# Patient Record
Sex: Female | Born: 1985 | Race: White | Hispanic: No | Marital: Married | Smoking: Never smoker
Health system: Southern US, Community
[De-identification: ages and names within clinical notes are randomized; demographics above are authoritative.]

## PROBLEM LIST (undated history)

## (undated) DIAGNOSIS — Z8489 Family history of other specified conditions: Secondary | ICD-10-CM

## (undated) DIAGNOSIS — K219 Gastro-esophageal reflux disease without esophagitis: Secondary | ICD-10-CM

## (undated) DIAGNOSIS — R519 Headache, unspecified: Secondary | ICD-10-CM

## (undated) DIAGNOSIS — M549 Dorsalgia, unspecified: Secondary | ICD-10-CM

## (undated) HISTORY — PX: WISDOM TOOTH EXTRACTION: SHX21

---

## 2007-06-20 ENCOUNTER — Inpatient Hospital Stay (HOSPITAL_COMMUNITY): Admission: AD | Admit: 2007-06-20 | Discharge: 2007-06-20 | Payer: Self-pay | Admitting: Obstetrics and Gynecology

## 2007-06-24 ENCOUNTER — Inpatient Hospital Stay (HOSPITAL_COMMUNITY): Admission: AD | Admit: 2007-06-24 | Discharge: 2007-06-24 | Payer: Self-pay | Admitting: Obstetrics and Gynecology

## 2007-09-27 ENCOUNTER — Ambulatory Visit (HOSPITAL_COMMUNITY): Admission: RE | Admit: 2007-09-27 | Discharge: 2007-09-27 | Payer: Self-pay | Admitting: Obstetrics

## 2007-10-25 ENCOUNTER — Ambulatory Visit (HOSPITAL_COMMUNITY): Admission: RE | Admit: 2007-10-25 | Discharge: 2007-10-25 | Payer: Self-pay | Admitting: Obstetrics

## 2007-11-22 ENCOUNTER — Ambulatory Visit (HOSPITAL_COMMUNITY): Admission: RE | Admit: 2007-11-22 | Discharge: 2007-11-22 | Payer: Self-pay | Admitting: Obstetrics

## 2007-12-20 ENCOUNTER — Ambulatory Visit (HOSPITAL_COMMUNITY): Admission: RE | Admit: 2007-12-20 | Discharge: 2007-12-20 | Payer: Self-pay | Admitting: Obstetrics

## 2008-01-15 ENCOUNTER — Inpatient Hospital Stay (HOSPITAL_COMMUNITY): Admission: AD | Admit: 2008-01-15 | Discharge: 2008-01-15 | Payer: Self-pay | Admitting: Obstetrics and Gynecology

## 2008-02-04 ENCOUNTER — Inpatient Hospital Stay (HOSPITAL_COMMUNITY): Admission: AD | Admit: 2008-02-04 | Discharge: 2008-02-07 | Payer: Self-pay | Admitting: *Deleted

## 2008-02-04 ENCOUNTER — Encounter (INDEPENDENT_AMBULATORY_CARE_PROVIDER_SITE_OTHER): Payer: Self-pay | Admitting: *Deleted

## 2009-03-08 ENCOUNTER — Inpatient Hospital Stay (HOSPITAL_COMMUNITY): Admission: AD | Admit: 2009-03-08 | Discharge: 2009-03-08 | Payer: Self-pay | Admitting: Obstetrics & Gynecology

## 2009-03-12 ENCOUNTER — Inpatient Hospital Stay (HOSPITAL_COMMUNITY): Admission: AD | Admit: 2009-03-12 | Discharge: 2009-03-12 | Payer: Self-pay | Admitting: Obstetrics and Gynecology

## 2009-03-12 ENCOUNTER — Ambulatory Visit: Payer: Self-pay | Admitting: Family

## 2009-03-15 ENCOUNTER — Ambulatory Visit (HOSPITAL_COMMUNITY): Admission: RE | Admit: 2009-03-15 | Discharge: 2009-03-15 | Payer: Self-pay | Admitting: Obstetrics & Gynecology

## 2009-04-18 ENCOUNTER — Ambulatory Visit (HOSPITAL_COMMUNITY): Admission: RE | Admit: 2009-04-18 | Discharge: 2009-04-18 | Payer: Self-pay | Admitting: Obstetrics

## 2009-04-22 ENCOUNTER — Ambulatory Visit (HOSPITAL_COMMUNITY): Admission: AD | Admit: 2009-04-22 | Discharge: 2009-04-22 | Payer: Self-pay | Admitting: Obstetrics

## 2009-04-24 ENCOUNTER — Ambulatory Visit (HOSPITAL_COMMUNITY): Admission: RE | Admit: 2009-04-24 | Discharge: 2009-04-24 | Payer: Self-pay | Admitting: Obstetrics

## 2009-05-02 ENCOUNTER — Ambulatory Visit (HOSPITAL_COMMUNITY): Admission: RE | Admit: 2009-05-02 | Discharge: 2009-05-02 | Payer: Self-pay | Admitting: Obstetrics

## 2009-05-09 ENCOUNTER — Ambulatory Visit (HOSPITAL_COMMUNITY): Admission: RE | Admit: 2009-05-09 | Discharge: 2009-05-09 | Payer: Self-pay | Admitting: Obstetrics

## 2009-05-21 ENCOUNTER — Ambulatory Visit (HOSPITAL_COMMUNITY): Admission: RE | Admit: 2009-05-21 | Discharge: 2009-05-21 | Payer: Self-pay | Admitting: Obstetrics

## 2009-06-18 ENCOUNTER — Ambulatory Visit (HOSPITAL_COMMUNITY): Admission: RE | Admit: 2009-06-18 | Discharge: 2009-06-18 | Payer: Self-pay | Admitting: Obstetrics

## 2009-07-18 ENCOUNTER — Ambulatory Visit (HOSPITAL_COMMUNITY): Admission: RE | Admit: 2009-07-18 | Discharge: 2009-07-18 | Payer: Self-pay | Admitting: Obstetrics

## 2009-08-15 ENCOUNTER — Ambulatory Visit (HOSPITAL_COMMUNITY): Admission: RE | Admit: 2009-08-15 | Discharge: 2009-08-15 | Payer: Self-pay | Admitting: Obstetrics & Gynecology

## 2009-09-26 ENCOUNTER — Ambulatory Visit (HOSPITAL_COMMUNITY): Admission: RE | Admit: 2009-09-26 | Discharge: 2009-09-26 | Payer: Self-pay | Admitting: Obstetrics & Gynecology

## 2009-10-16 ENCOUNTER — Inpatient Hospital Stay (HOSPITAL_COMMUNITY): Admission: AD | Admit: 2009-10-16 | Discharge: 2009-10-17 | Payer: Self-pay | Admitting: Obstetrics and Gynecology

## 2009-10-17 ENCOUNTER — Ambulatory Visit (HOSPITAL_COMMUNITY): Admission: RE | Admit: 2009-10-17 | Discharge: 2009-10-17 | Payer: Self-pay | Admitting: Obstetrics & Gynecology

## 2009-10-18 ENCOUNTER — Ambulatory Visit (HOSPITAL_COMMUNITY): Admission: RE | Admit: 2009-10-18 | Discharge: 2009-10-18 | Payer: Self-pay | Admitting: Obstetrics and Gynecology

## 2009-10-24 ENCOUNTER — Ambulatory Visit (HOSPITAL_COMMUNITY): Admission: RE | Admit: 2009-10-24 | Discharge: 2009-10-24 | Payer: Self-pay | Admitting: Obstetrics and Gynecology

## 2009-10-31 ENCOUNTER — Ambulatory Visit: Admission: RE | Admit: 2009-10-31 | Discharge: 2009-10-31 | Payer: Self-pay | Admitting: Obstetrics and Gynecology

## 2009-11-01 ENCOUNTER — Inpatient Hospital Stay (HOSPITAL_COMMUNITY): Admission: AD | Admit: 2009-11-01 | Discharge: 2009-11-03 | Payer: Self-pay | Admitting: Obstetrics and Gynecology

## 2009-11-01 ENCOUNTER — Encounter (INDEPENDENT_AMBULATORY_CARE_PROVIDER_SITE_OTHER): Payer: Self-pay | Admitting: Obstetrics and Gynecology

## 2010-07-27 ENCOUNTER — Encounter: Payer: Self-pay | Admitting: Obstetrics

## 2010-09-23 LAB — RPR
RPR Ser Ql: REACTIVE — AB
RPR Ser Ql: REACTIVE — AB

## 2010-09-23 LAB — CBC
HCT: 31.5 % — ABNORMAL LOW (ref 36.0–46.0)
MCHC: 33.2 g/dL (ref 30.0–36.0)
MCV: 77.7 fL — ABNORMAL LOW (ref 78.0–100.0)
Platelets: 182 10*3/uL (ref 150–400)
Platelets: 209 10*3/uL (ref 150–400)
RDW: 16.1 % — ABNORMAL HIGH (ref 11.5–15.5)
WBC: 16.2 10*3/uL — ABNORMAL HIGH (ref 4.0–10.5)

## 2010-09-23 LAB — T.PALLIDUM AB, IGG: T pallidum Antibodies (TP-PA): 0.7 IV (ref <1.0)

## 2010-09-24 LAB — GC/CHLAMYDIA PROBE AMP, GENITAL
Chlamydia, DNA Probe: NEGATIVE
GC Probe Amp, Genital: NEGATIVE

## 2010-09-24 LAB — WET PREP, GENITAL: Trich, Wet Prep: NONE SEEN

## 2010-10-10 LAB — CBC
Platelets: 188 10*3/uL (ref 150–400)
RDW: 15.4 % (ref 11.5–15.5)

## 2010-10-10 LAB — GC/CHLAMYDIA PROBE AMP, GENITAL: GC Probe Amp, Genital: NEGATIVE

## 2010-10-10 LAB — POCT PREGNANCY, URINE: Preg Test, Ur: POSITIVE

## 2010-10-10 LAB — URINE MICROSCOPIC-ADD ON

## 2010-10-10 LAB — URINALYSIS, ROUTINE W REFLEX MICROSCOPIC
Glucose, UA: NEGATIVE mg/dL
Specific Gravity, Urine: 1.02 (ref 1.005–1.030)

## 2010-10-10 LAB — WET PREP, GENITAL: Clue Cells Wet Prep HPF POC: NONE SEEN

## 2010-10-10 LAB — HCG, QUANTITATIVE, PREGNANCY: hCG, Beta Chain, Quant, S: 19757 m[IU]/mL — ABNORMAL HIGH (ref <5)

## 2010-10-10 LAB — ABO/RH: ABO/RH(D): A POS

## 2010-11-18 NOTE — Consult Note (Signed)
Jamie Mosley, Jamie Mosley            ACCOUNT NO.:  [id number]   MEDICAL RECORD NO.:  [id number]          PATIENT TYPE:  MAT   LOCATION:  [facility]                          FACILITY:  [facility]   PHYSICIAN:  Lenoard Aden, M.D.DATE OF BIRTH:  02/23/1986   DATE OF CONSULTATION:  DATE OF DISCHARGE:  01/15/2008                                 CONSULTATION   CHIEF COMPLAINT:  Rule out ruptured membranes.   HISTORY OF PRESENT ILLNESS:  She is a 25 year old white female G1, P0,  at 22 weeks' gestation, who presents with questionable leakage of fluid  this afternoon.  She is a nonsmoker and nondrinker.  She denies domestic  physical violence.   MEDICATIONS:  Valtrex, prenatal vitamins.  She does report questionable  new-onset herpes prodrome.   ALLERGIES:  She has allergies only to sweet potatoes.   FAMILY HISTORY:  Anemia, alcohol abuse.  Previous pregnancy,  noncontributory.   SOCIAL HISTORY:  Noncontributory.   Prenatal course complicated by slightly elevated blood pressure.   PHYSICAL EXAMINATION:  GENERAL:  She is a well-developed, well-nourished  white female in no acute distress.  HEENT:  Normal.  LUNGS:  Clear.  HEART:  Regular rate and rhythm.  ABDOMEN:  Soft, gravid, and nontender.  CERVICAL:  Deferred.  EXTREMITIES:  DTRs 2+ and equal.  NEUROLOGIC:  Nonfocal.  SKIN:  Intact.  PERINEAL:  Reveals no evidence of rupture of membranes, fern and  Nitrazine negative.  No evidence of active herpes lesions.   IMPRESSION:  1. A 36-week OB.  2. No evidence of spontaneous rupture of membranes with reactive      nonstress test noted.  3. New onset of prodromal herpes simplex virus.  We will increase the      therapeutic dosages.  Increase to 500 b.i.d. x7 days.   PLAN:  To follow up in the office as scheduled.      Lenoard Aden, M.D.  Electronically Signed     RJT/MEDQ  D:  01/15/2008  T:  01/16/2008  Job:  [id number]

## 2011-01-16 IMAGING — US US OB TRANSVAGINAL
1 series · 14 of 22 positions shown · non-contrast
Comparison: none

OBSTETRICAL ULTRASOUND:
 This ultrasound exam was performed in the [HOSPITAL] Ultrasound Department.  The OB US report was generated in the AS system, and faxed to the ordering physician.  This report is also available in [REDACTED] PACS.

[Series 1: us ob transvaginal · 0.12mm/px · 14 of 22 slices shown]
[im 1/22]
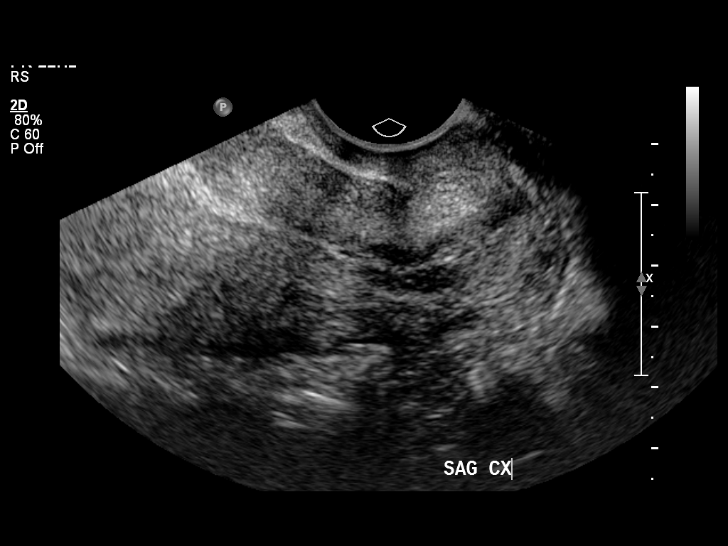
[im 3/22]
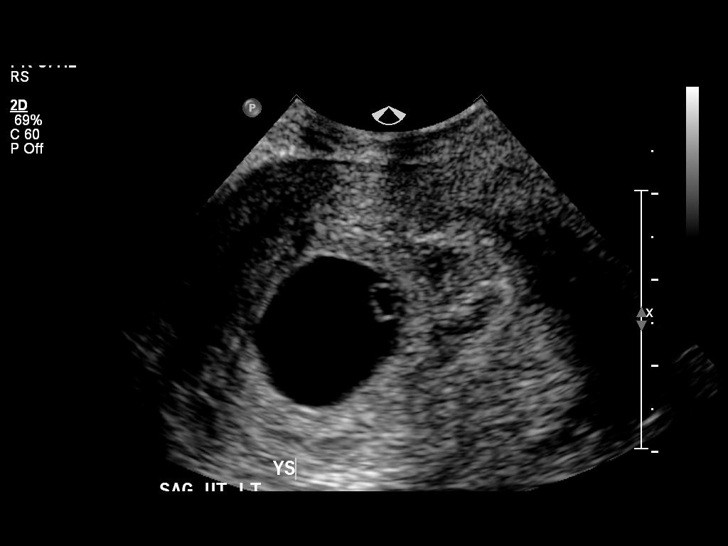
[im 4/22]
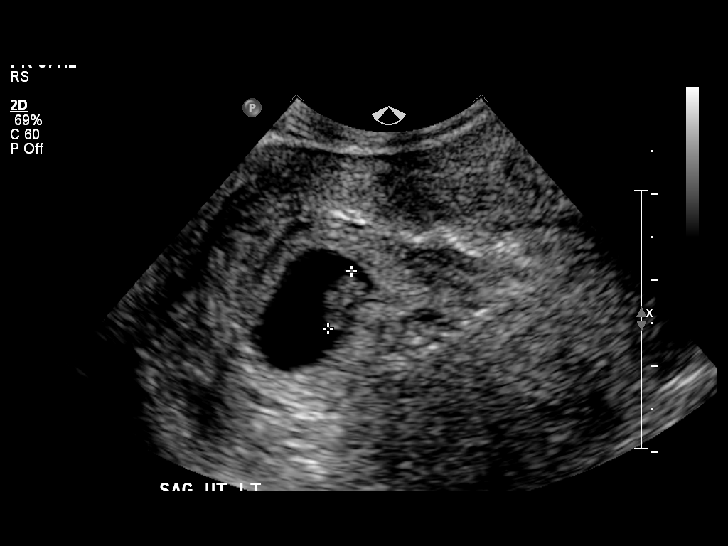
[im 6/22]
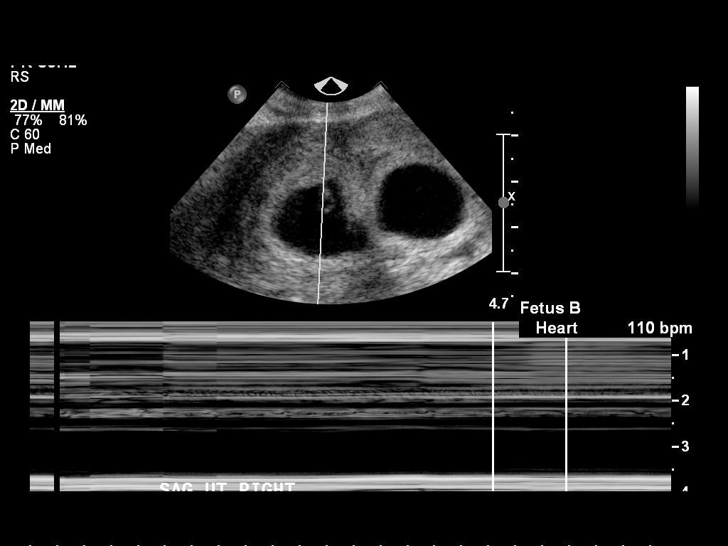
[im 8/22]
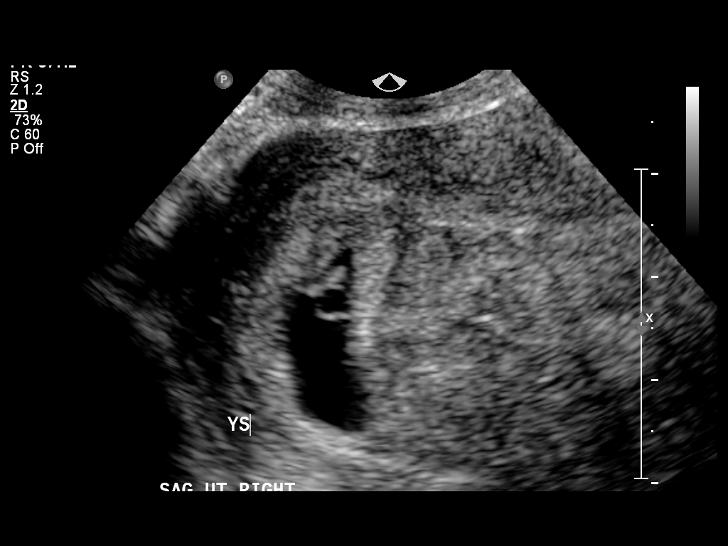
[im 9/22]
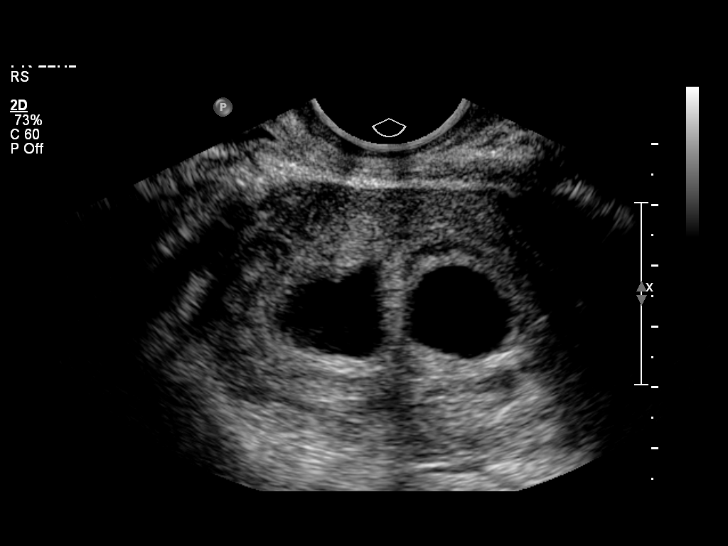
[im 11/22]
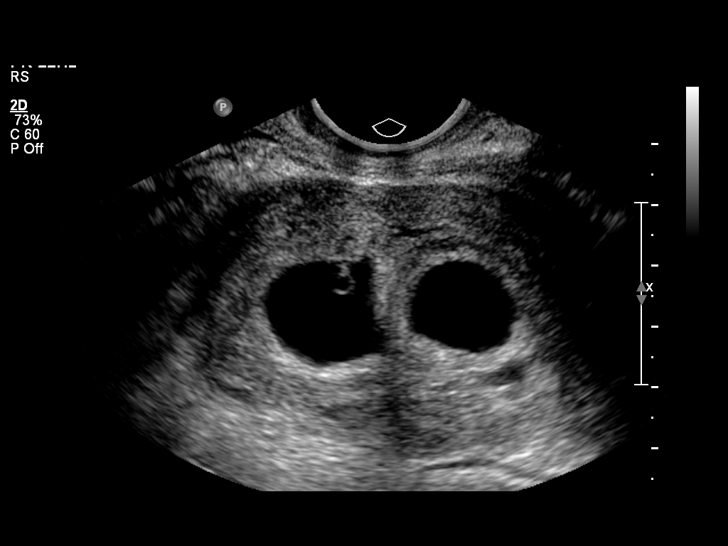
[im 12/22]
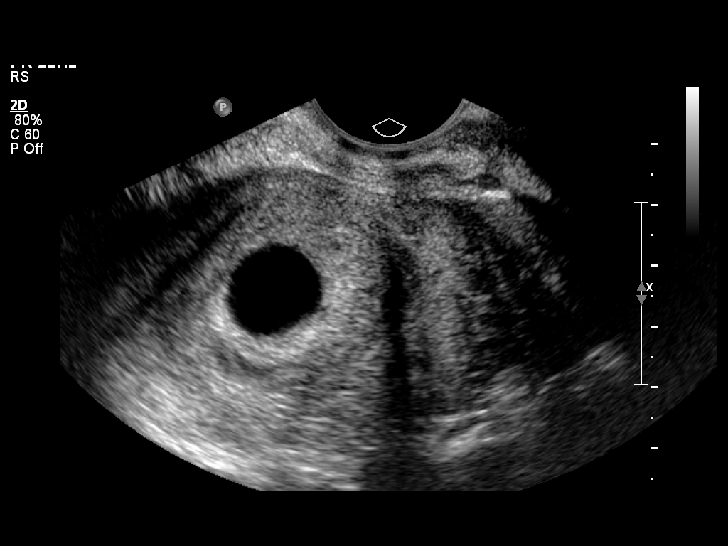
[im 14/22]
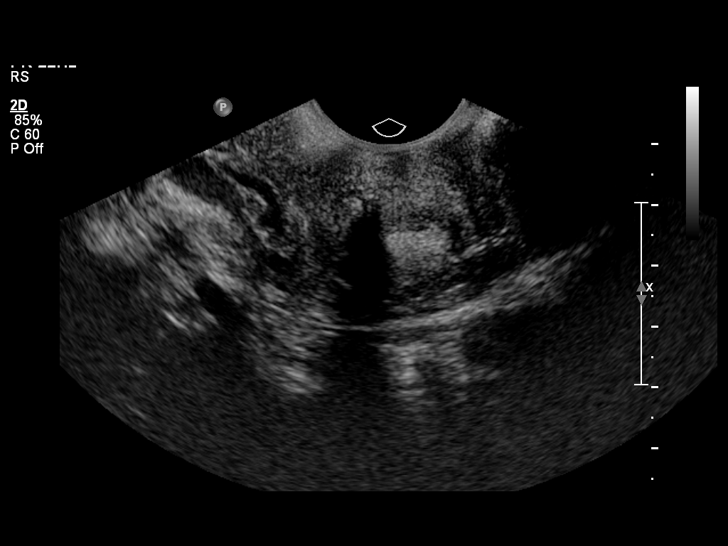
[im 15/22]
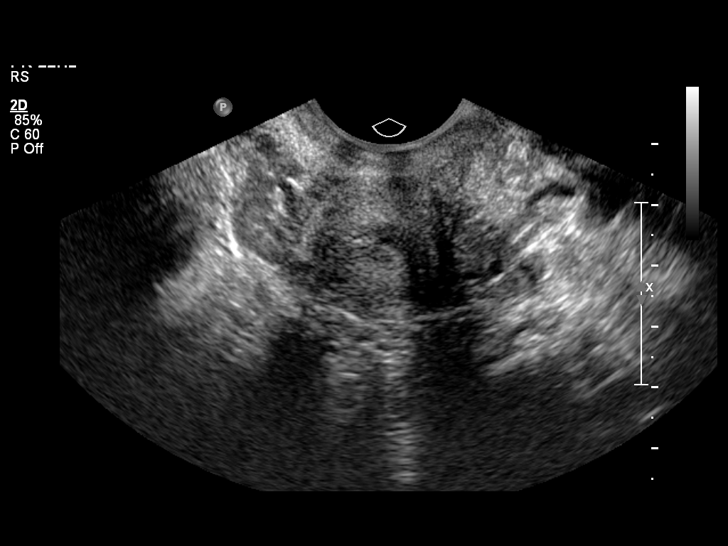
[im 17/22]
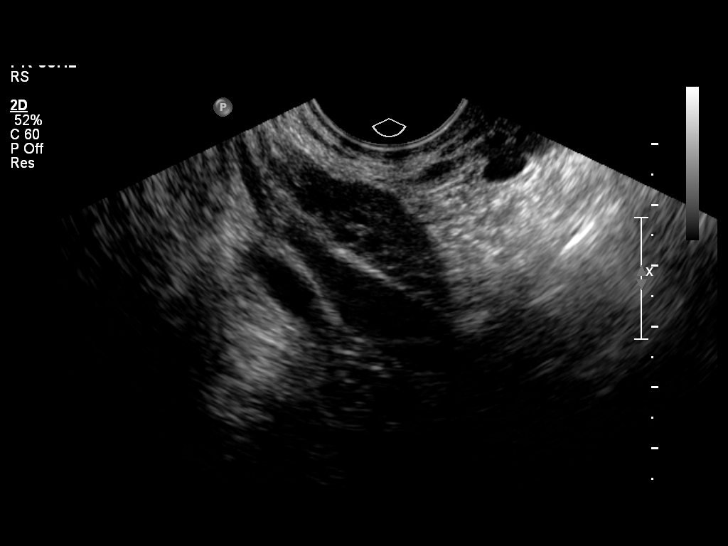
[im 19/22]
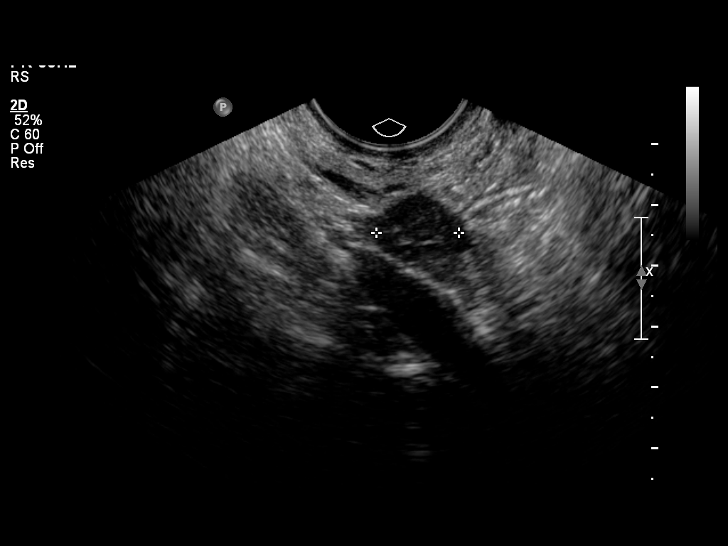
[im 20/22]
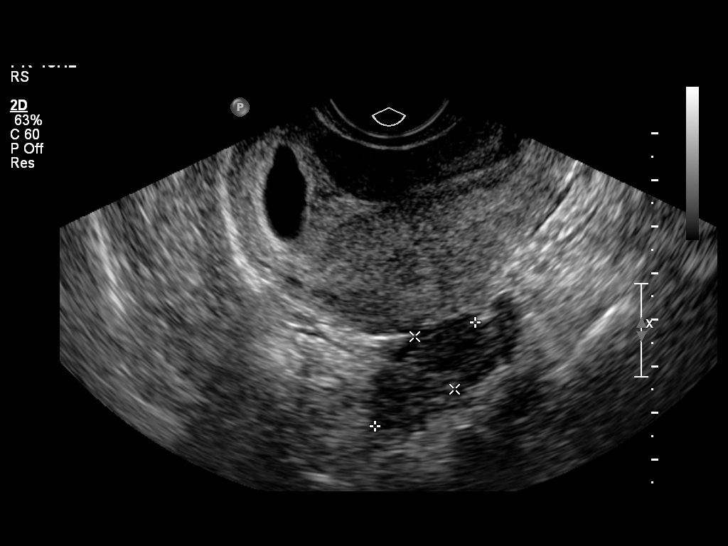
[im 22/22]
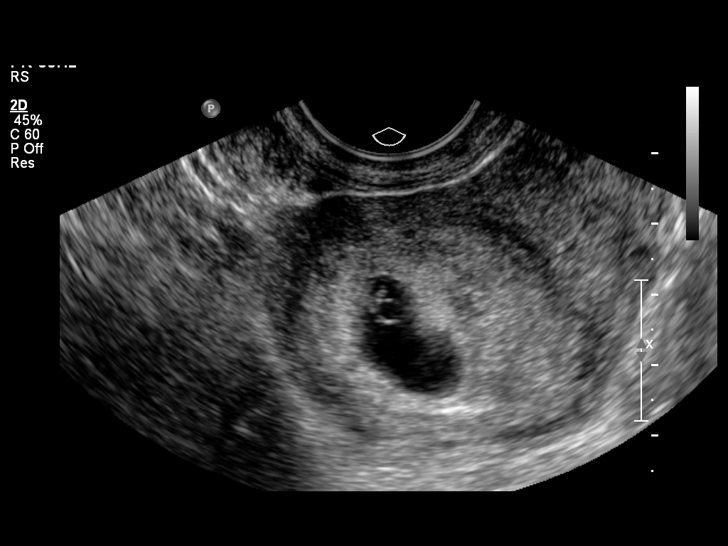

[14 of 22 positions shown; findings below may reference images not displayed]

IMPRESSION: See AS Obstetric US report.

## 2011-03-30 LAB — TORCH-IGM(TOXO/ RUB/ CMV/ HSV) W TITER
CMV IgM: 0.48 IV
Rubella IgM Index: 0.63 IV
Toxoplasma IgM: 0.36 IV

## 2011-03-30 LAB — TORCH TITERS-IGG(TOXO/ RUB/ CMV/ HSV)
CMV IgG: 9.34 IV
Toxoplasma IgG Antibody (EIA): <5 IU/mL

## 2011-03-31 LAB — TOXOPLASMA GONDII ANTIBODY, IGM: Toxoplasma Antibody- IgM: <0.1 IV

## 2011-04-03 LAB — CBC
HCT: 31 — ABNORMAL LOW
Hemoglobin: 10.3 — ABNORMAL LOW
Hemoglobin: 11.5 — ABNORMAL LOW
MCHC: 33.4
MCV: 85
RBC: 3.61 — ABNORMAL LOW
RBC: 4.07
WBC: 14.7 — ABNORMAL HIGH

## 2011-04-03 LAB — RPR: RPR Ser Ql: NONREACTIVE

## 2011-04-10 LAB — POCT PREGNANCY, URINE
Operator id: 134401
Preg Test, Ur: POSITIVE

## 2011-04-10 LAB — URINALYSIS, ROUTINE W REFLEX MICROSCOPIC
Glucose, UA: NEGATIVE
Hgb urine dipstick: NEGATIVE
Protein, ur: NEGATIVE
Specific Gravity, Urine: >1.03 — ABNORMAL HIGH

## 2011-04-10 LAB — WET PREP, GENITAL: Yeast Wet Prep HPF POC: NONE SEEN

## 2011-04-10 LAB — ABO/RH: ABO/RH(D): A POS

## 2011-07-30 IMAGING — US US OB FOLLOW-UP
1 series · 14 of 28 positions shown · non-contrast
Comparison: none

OBSTETRICAL ULTRASOUND:
 This ultrasound was performed in The [HOSPITAL], and the AS OB/GYN report will be stored to [REDACTED] PACS.  This report is also available in [HOSPITAL]?s accessANYware.

[Series 1: us ob follow-up · 14 of 31 slices shown]
[im 2/31]
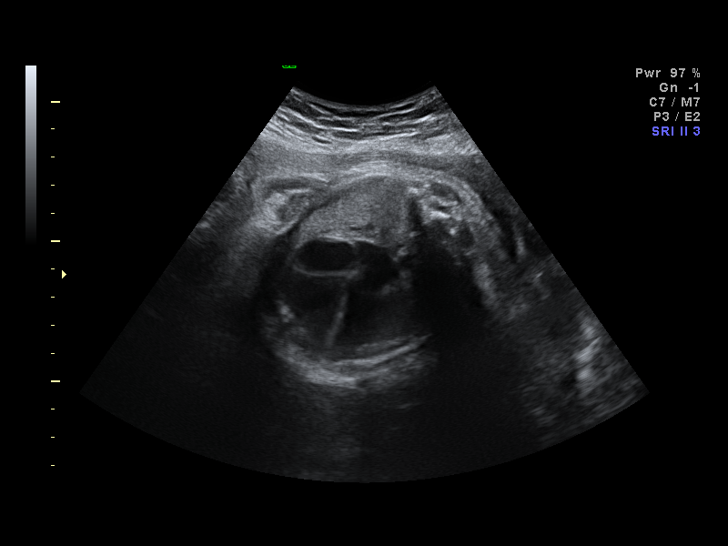
[im 4/31]
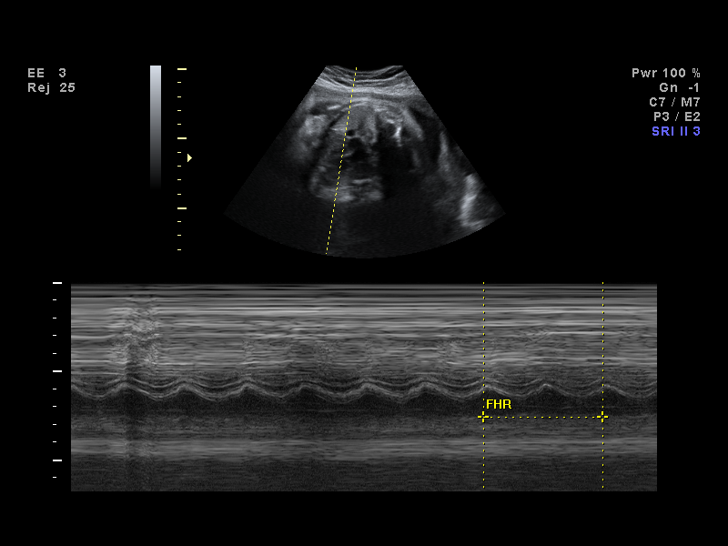
[im 6/31]
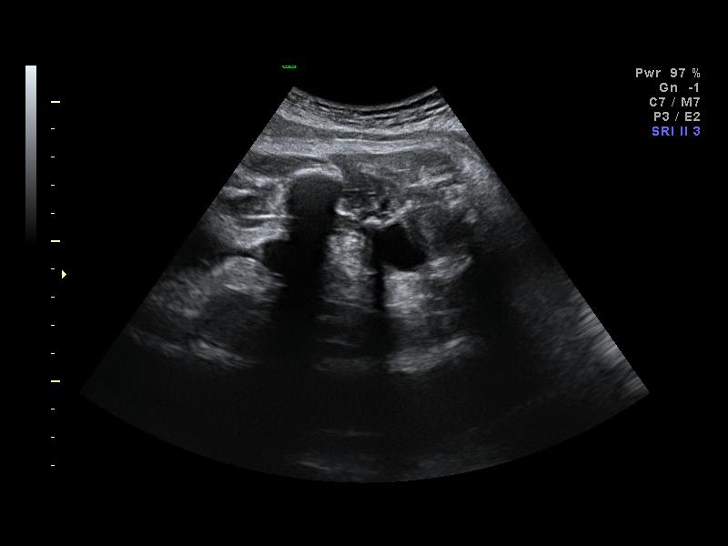
[im 8/31]
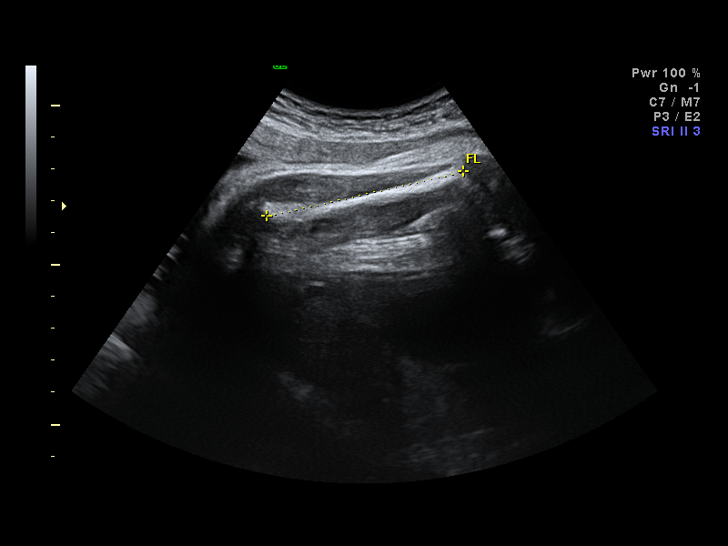
[im 11/31]
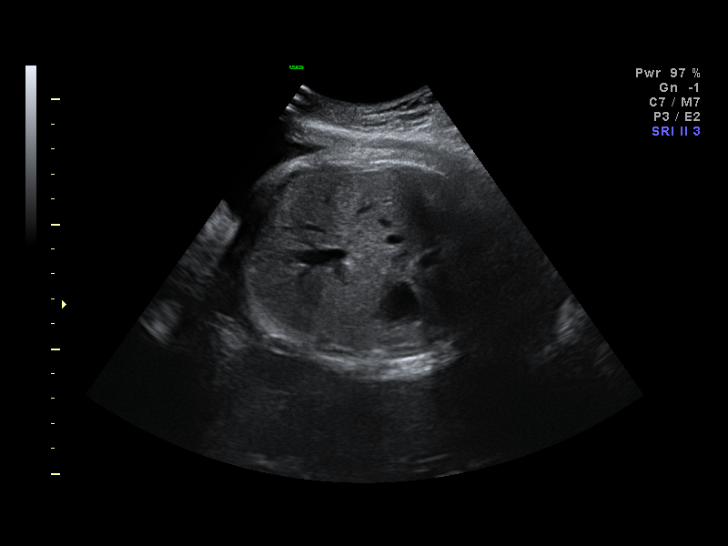
[im 13/31]
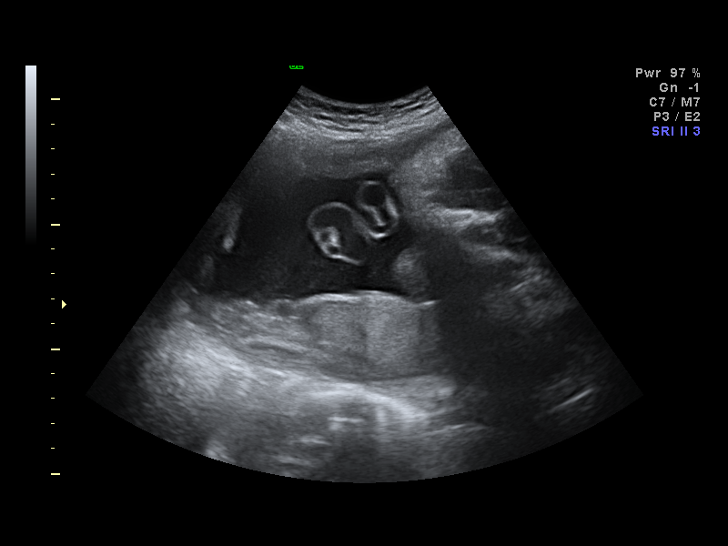
[im 15/31]
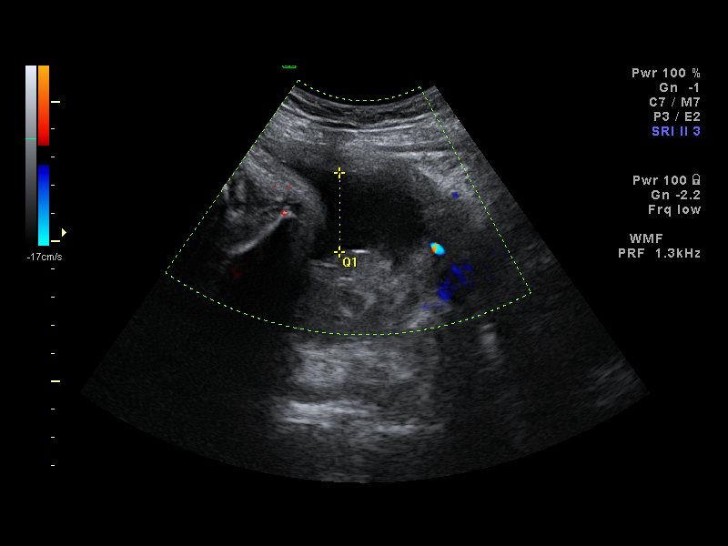
[im 17/31]
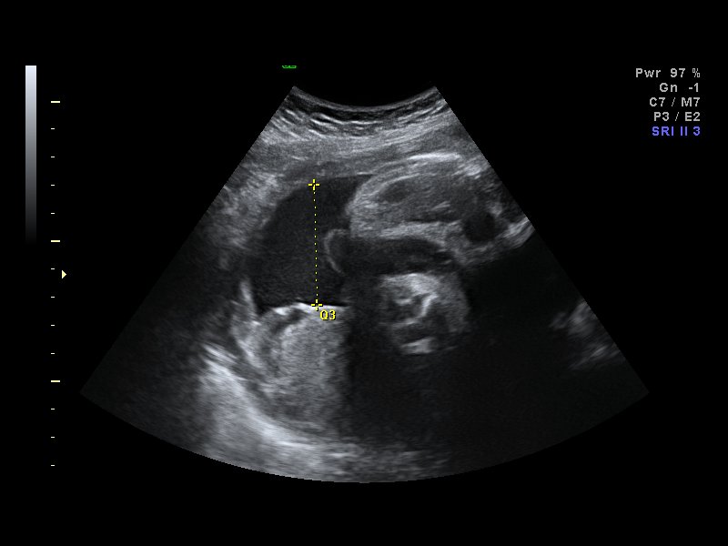
[im 19/31]
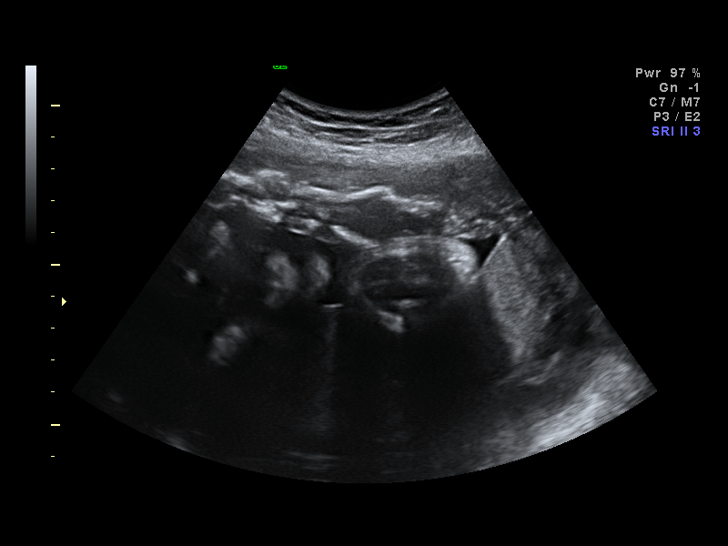
[im 22/31]
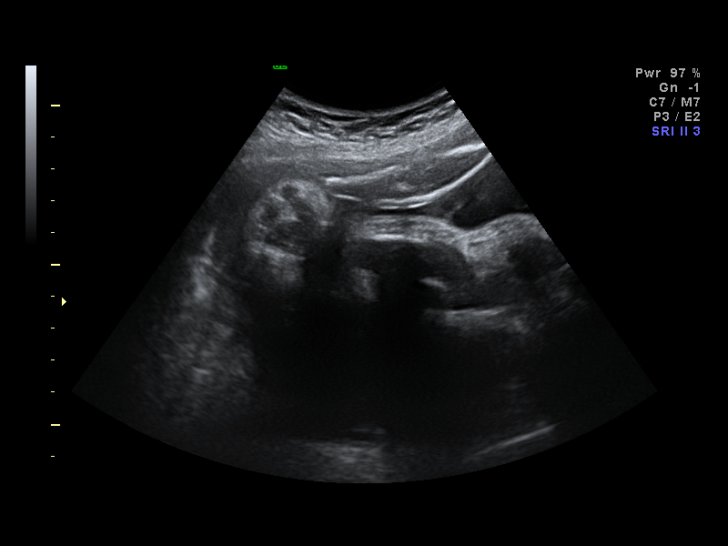
[im 24/31]
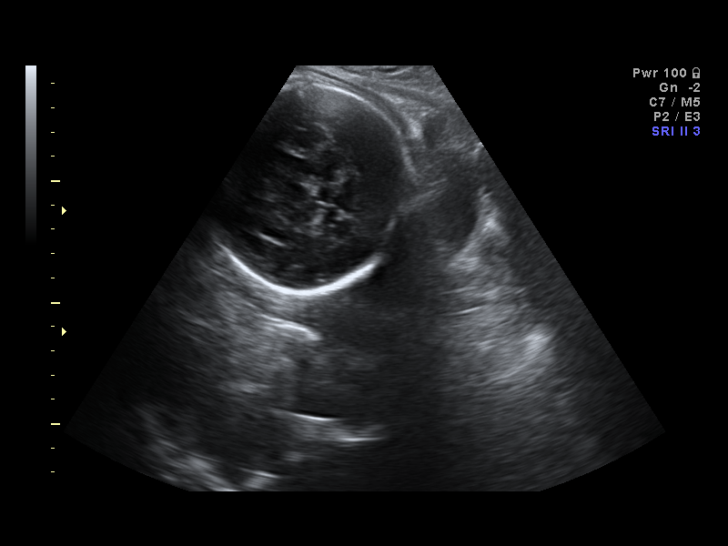
[im 26/31]
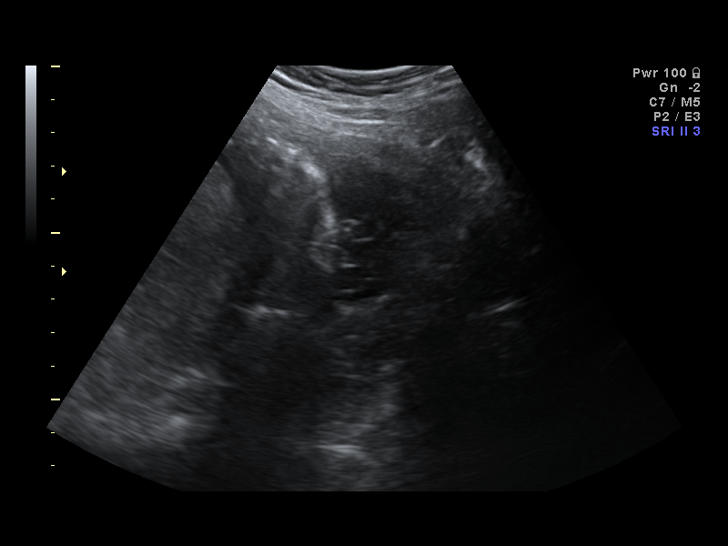
[im 28/31]
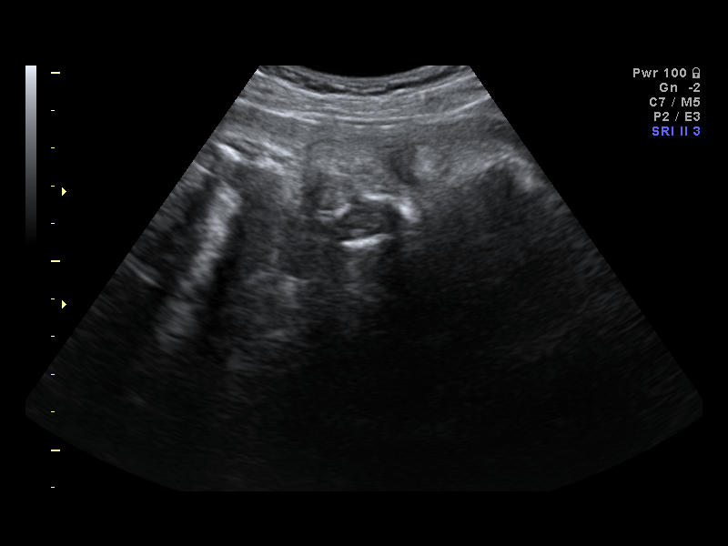
[im 31/31]
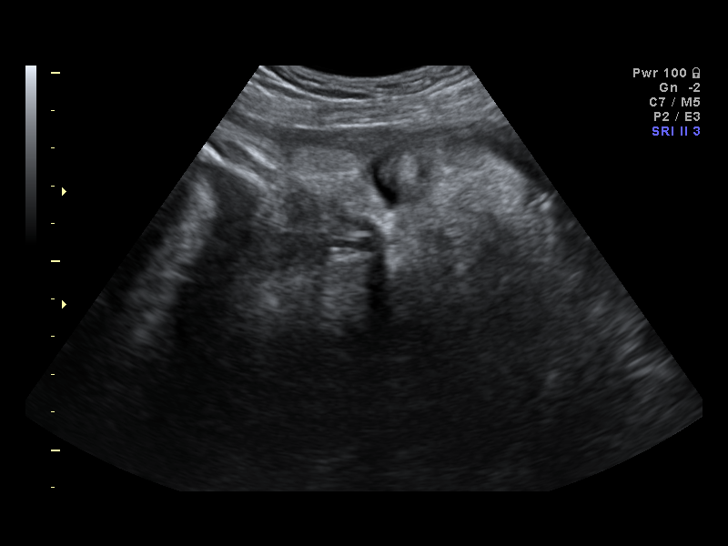

[14 of 28 positions shown; findings below may reference images not displayed]

IMPRESSION: [facility] OB/GYN has also been faxed to the ordering physician.

## 2011-08-20 IMAGING — US US OB FOLLOW-UP
1 series · 14 of 28 positions shown · non-contrast
Comparison: none

OBSTETRICAL ULTRASOUND:
 This ultrasound was performed in The [HOSPITAL], and the AS OB/GYN report will be stored to [REDACTED] PACS.  This report is also available in [HOSPITAL]?s accessANYware.

[Series 1: us ob follow-up · 14 of 36 slices shown]
[im 2/36]
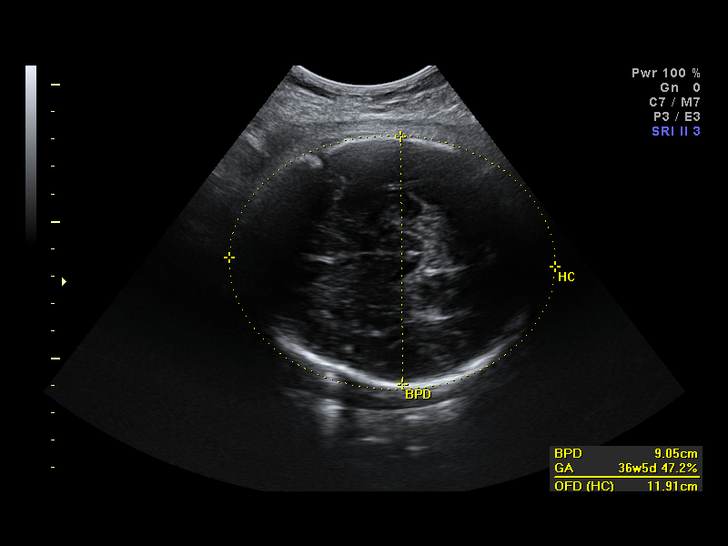
[im 4/36]
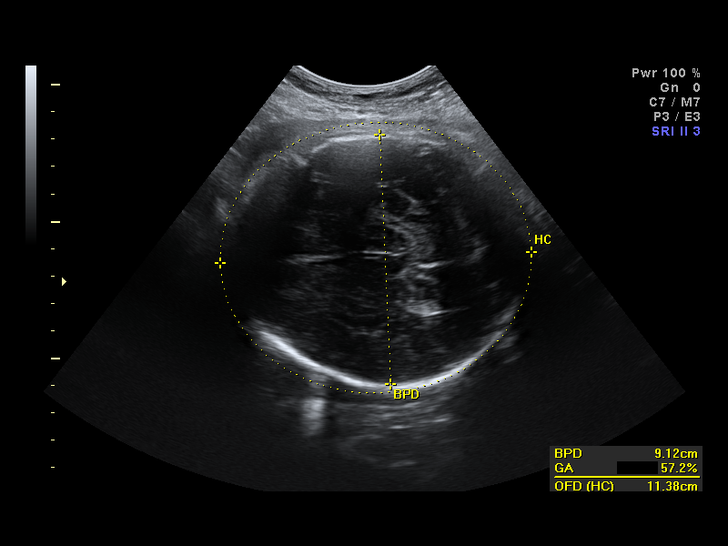
[im 7/36]
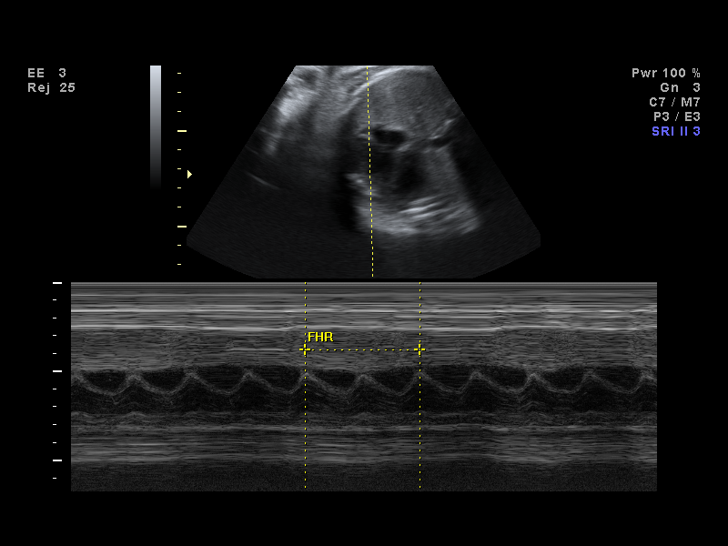
[im 10/36]
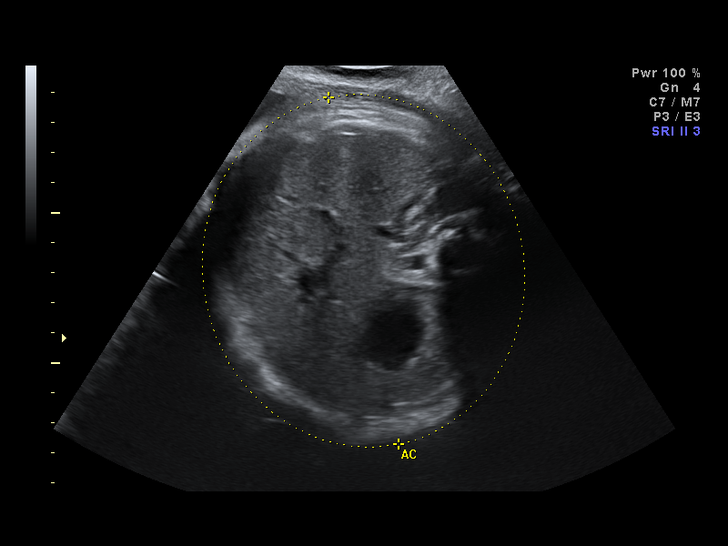
[im 12/36]
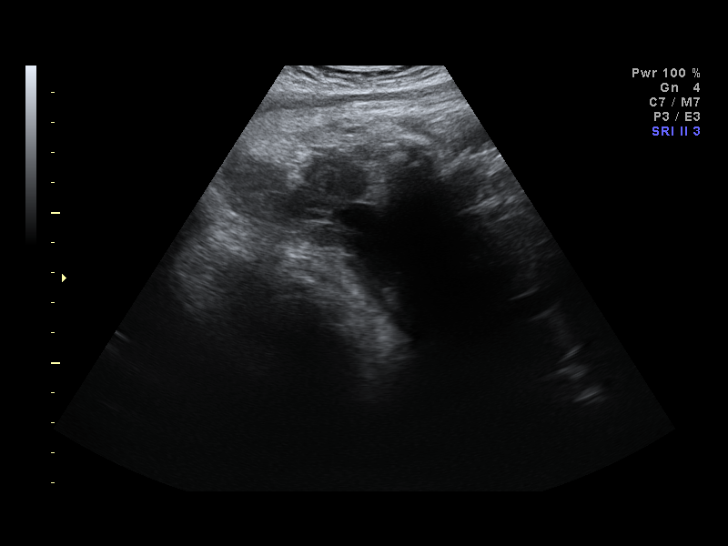
[im 15/36]
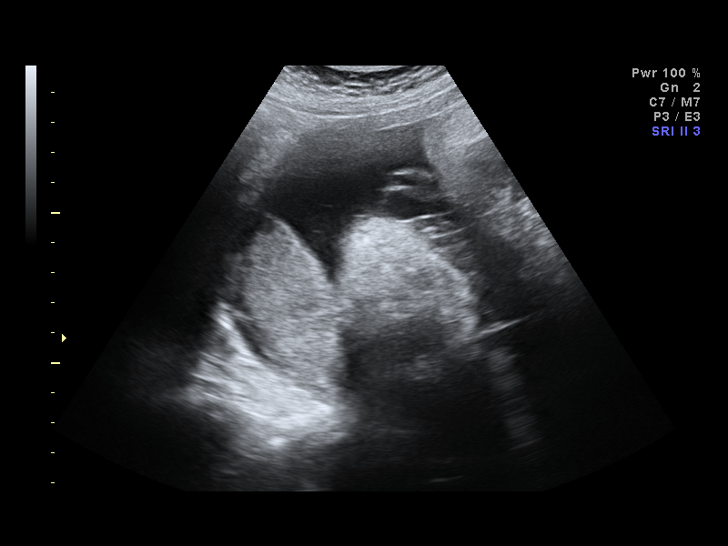
[im 17/36]
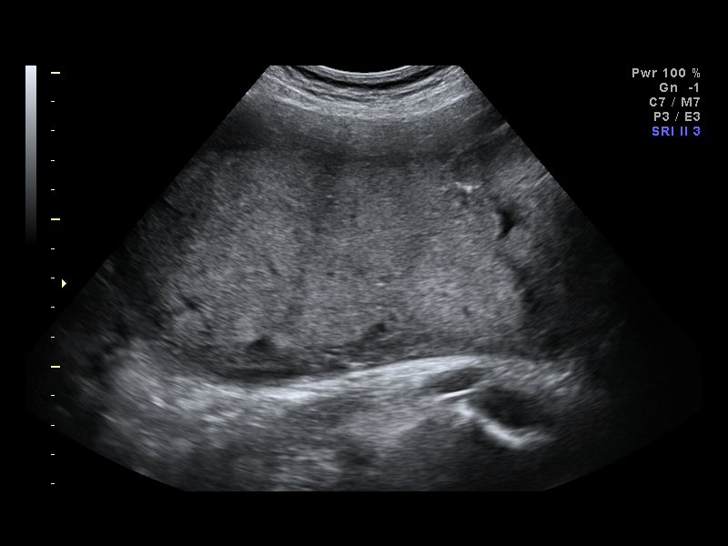
[im 20/36]
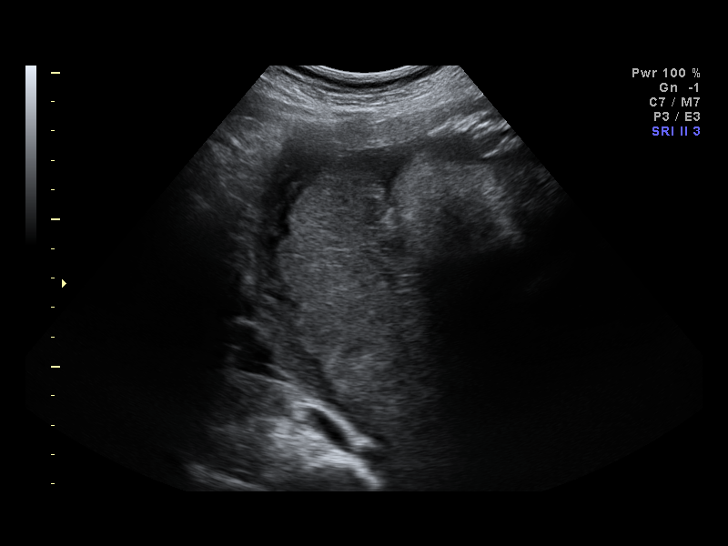
[im 23/36]
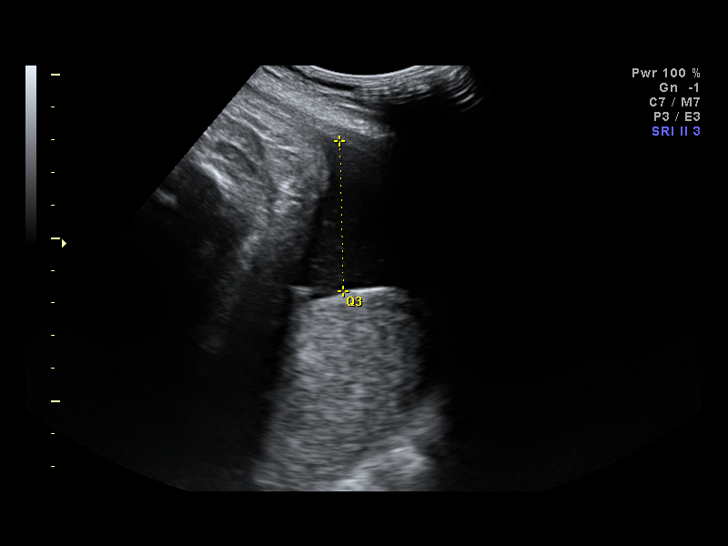
[im 25/36]
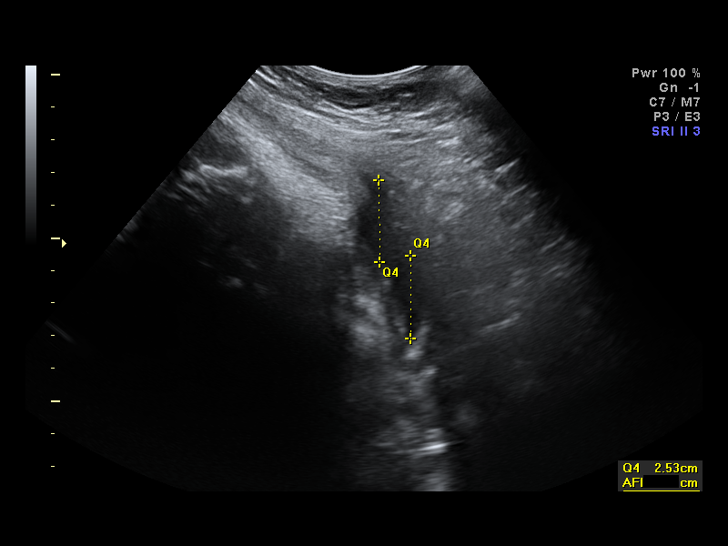
[im 28/36]
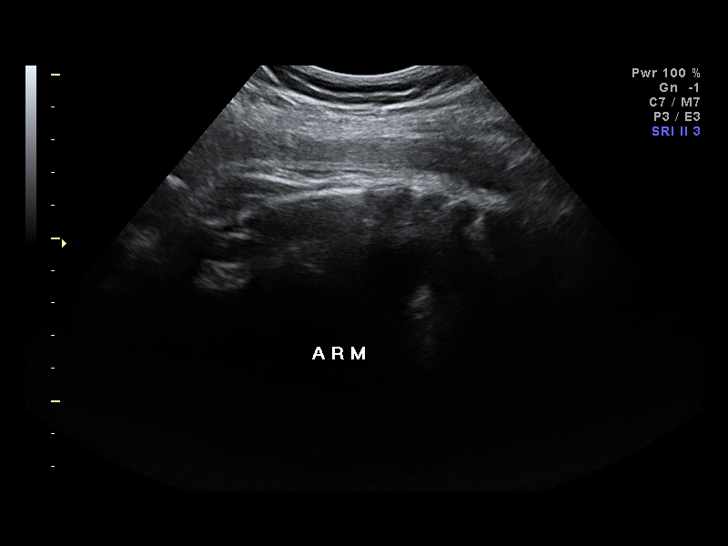
[im 30/36]
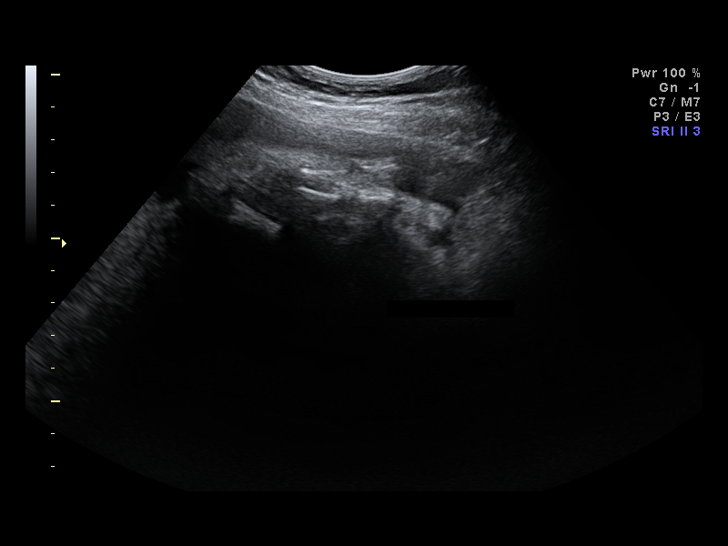
[im 33/36]
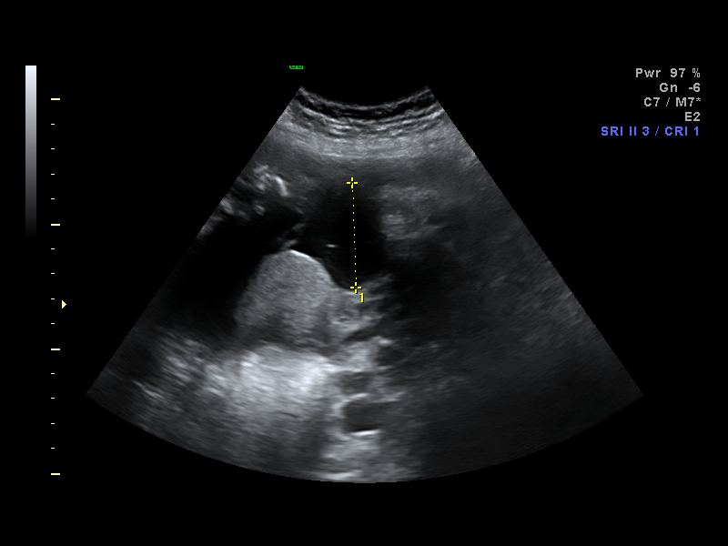
[im 36/36]
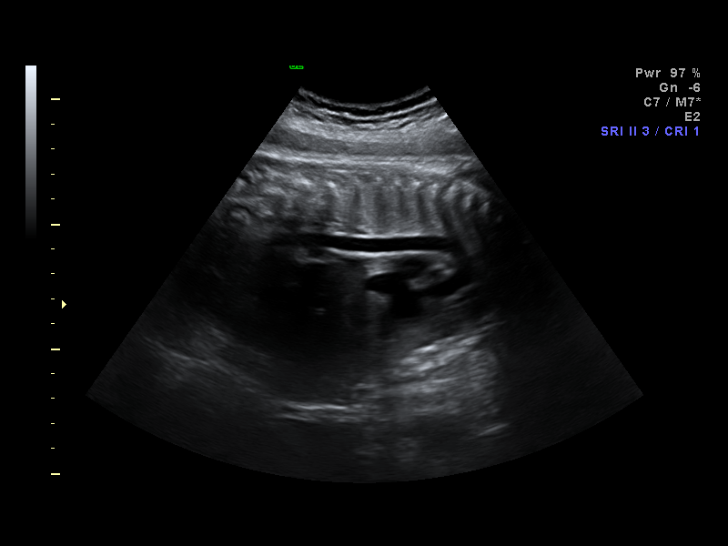

[14 of 28 positions shown; findings below may reference images not displayed]

IMPRESSION: [facility] OB/GYN has also been faxed to the ordering physician.

## 2019-04-07 ENCOUNTER — Other Ambulatory Visit: Payer: Self-pay | Admitting: Obstetrics & Gynecology

## 2019-05-27 ENCOUNTER — Other Ambulatory Visit (HOSPITAL_COMMUNITY)
Admission: RE | Admit: 2019-05-27 | Discharge: 2019-05-27 | Disposition: A | Discharge status: Home / Self Care | Payer: 59 | Source: Ambulatory Visit | Attending: Obstetrics & Gynecology | Admitting: Obstetrics & Gynecology

## 2019-05-27 DIAGNOSIS — Z01812 Encounter for preprocedural laboratory examination: Secondary | ICD-10-CM | POA: Diagnosis present

## 2019-05-27 DIAGNOSIS — Z20828 Contact with and (suspected) exposure to other viral communicable diseases: Secondary | ICD-10-CM | POA: Diagnosis not present

## 2019-05-29 ENCOUNTER — Other Ambulatory Visit: Payer: Self-pay

## 2019-05-29 ENCOUNTER — Encounter (HOSPITAL_BASED_OUTPATIENT_CLINIC_OR_DEPARTMENT_OTHER): Payer: Self-pay | Admitting: *Deleted

## 2019-05-29 LAB — NOVEL CORONAVIRUS, NAA (HOSP ORDER, SEND-OUT TO REF LAB; TAT 18-24 HRS): SARS-CoV-2, NAA: NOT DETECTED

## 2019-05-29 NOTE — Progress Notes (Addendum)
Spoke w/ via phone for pre-op interview---michelleLab needs dos---cbc, type and screen-, urine pregnancy            Lab results------ COVID test ------05-27-2019 Arrive at -------940 am NPO after ------midnight Medications to take morning of surgery -----none Diabetic medication -----n/a Patient Special Instructions ----- Pre-Op special Istructions ----- Patient verbalized understanding of instructions that were given at this phone interview. Patient denies shortness of breath, chest pain, fever, cough a this phone interview.

## 2019-05-31 ENCOUNTER — Encounter (HOSPITAL_BASED_OUTPATIENT_CLINIC_OR_DEPARTMENT_OTHER): Payer: Self-pay | Admitting: *Deleted

## 2019-05-31 ENCOUNTER — Ambulatory Visit (HOSPITAL_BASED_OUTPATIENT_CLINIC_OR_DEPARTMENT_OTHER): Payer: 59 | Admitting: Anesthesiology

## 2019-05-31 ENCOUNTER — Encounter (HOSPITAL_BASED_OUTPATIENT_CLINIC_OR_DEPARTMENT_OTHER)
Admission: RE | Disposition: A | Discharge status: Home / Self Care | Payer: Self-pay | Source: Home / Self Care | Attending: Obstetrics & Gynecology

## 2019-05-31 ENCOUNTER — Other Ambulatory Visit: Payer: Self-pay

## 2019-05-31 ENCOUNTER — Ambulatory Visit (HOSPITAL_BASED_OUTPATIENT_CLINIC_OR_DEPARTMENT_OTHER)
Admission: RE | Admit: 2019-05-31 | Discharge: 2019-05-31 | Disposition: A | Discharge status: Home / Self Care | Payer: 59 | Attending: Obstetrics & Gynecology | Admitting: Obstetrics & Gynecology

## 2019-05-31 DIAGNOSIS — Z302 Encounter for sterilization: Secondary | ICD-10-CM | POA: Insufficient documentation

## 2019-05-31 HISTORY — DX: Family history of other specified conditions: Z84.89

## 2019-05-31 HISTORY — PX: LAPAROSCOPIC TUBAL LIGATION: SHX1937

## 2019-05-31 HISTORY — PX: LAPAROSCOPY: SHX197

## 2019-05-31 HISTORY — DX: Gastro-esophageal reflux disease without esophagitis: K21.9

## 2019-05-31 HISTORY — DX: Headache, unspecified: R51.9

## 2019-05-31 HISTORY — DX: Dorsalgia, unspecified: M54.9

## 2019-05-31 LAB — CBC
HCT: 43.5 % (ref 36.0–46.0)
Hemoglobin: 14.4 g/dL (ref 12.0–15.0)
MCH: 31.3 pg (ref 26.0–34.0)
MCHC: 33.1 g/dL (ref 30.0–36.0)
MCV: 94.6 fL (ref 80.0–100.0)
Platelets: 231 10*3/uL (ref 150–400)
RBC: 4.6 MIL/uL (ref 3.87–5.11)
RDW: 12 % (ref 11.5–15.5)
WBC: 9.6 10*3/uL (ref 4.0–10.5)
nRBC: 0 % (ref 0.0–0.2)

## 2019-05-31 LAB — TYPE AND SCREEN
ABO/RH(D): A POS
Antibody Screen: NEGATIVE

## 2019-05-31 LAB — POCT PREGNANCY, URINE: Preg Test, Ur: NEGATIVE

## 2019-05-31 LAB — ABO/RH: ABO/RH(D): A POS

## 2019-05-31 SURGERY — LIGATION, FALLOPIAN TUBE, LAPAROSCOPIC
Anesthesia: General | Site: Abdomen

## 2019-05-31 MED ORDER — ENSURE PRE-SURGERY PO LIQD
296.0000 mL | Freq: Once | ORAL | Status: DC
  Filled 2019-05-31: qty 296

## 2019-05-31 MED ORDER — OXYCODONE HCL 5 MG PO TABS
ORAL_TABLET | ORAL | Status: AC
  Filled 2019-05-31: qty 1

## 2019-05-31 MED ORDER — FENTANYL CITRATE (PF) 100 MCG/2ML IJ SOLN
INTRAMUSCULAR | Status: AC
  Filled 2019-05-31: qty 4

## 2019-05-31 MED ORDER — SUGAMMADEX SODIUM 200 MG/2ML IV SOLN
INTRAVENOUS | Status: DC | PRN
  Administered 2019-05-31: 300 mg via INTRAVENOUS

## 2019-05-31 MED ORDER — LIDOCAINE 2% (20 MG/ML) 5 ML SYRINGE
INTRAMUSCULAR | Status: DC | PRN
  Administered 2019-05-31: 60 mg via INTRAVENOUS

## 2019-05-31 MED ORDER — SCOPOLAMINE 1 MG/3DAYS TD PT72
MEDICATED_PATCH | TRANSDERMAL | Status: AC
  Filled 2019-05-31: qty 1

## 2019-05-31 MED ORDER — ACETAMINOPHEN 500 MG PO TABS
ORAL_TABLET | ORAL | Status: AC
  Filled 2019-05-31: qty 2

## 2019-05-31 MED ORDER — BUPIVACAINE HCL (PF) 0.25 % IJ SOLN
INTRAMUSCULAR | Status: DC | PRN
  Administered 2019-05-31: 7 mL
  Administered 2019-05-31: 3 mL

## 2019-05-31 MED ORDER — DEXAMETHASONE SODIUM PHOSPHATE 10 MG/ML IJ SOLN
INTRAMUSCULAR | Status: DC | PRN
  Administered 2019-05-31: 5 mg via INTRAVENOUS

## 2019-05-31 MED ORDER — KETOROLAC TROMETHAMINE 30 MG/ML IJ SOLN
INTRAMUSCULAR | Status: AC
  Filled 2019-05-31: qty 1

## 2019-05-31 MED ORDER — DOCUSATE SODIUM 100 MG PO CAPS: 100.0000 mg | ORAL_CAPSULE | Freq: Every day | ORAL | 0 refills | Status: AC

## 2019-05-31 MED ORDER — ONDANSETRON HCL 4 MG/2ML IJ SOLN
INTRAMUSCULAR | Status: AC
  Filled 2019-05-31: qty 2

## 2019-05-31 MED ORDER — ROCURONIUM BROMIDE 10 MG/ML (PF) SYRINGE
PREFILLED_SYRINGE | INTRAVENOUS | Status: DC | PRN
  Administered 2019-05-31: 60 mg via INTRAVENOUS

## 2019-05-31 MED ORDER — SODIUM CHLORIDE 0.9 % IR SOLN
Status: DC | PRN
  Administered 2019-05-31: 1

## 2019-05-31 MED ORDER — ACETAMINOPHEN 500 MG PO TABS
1000.0000 mg | ORAL_TABLET | ORAL | Status: AC
  Administered 2019-05-31: 1000 mg via ORAL
  Filled 2019-05-31: qty 2

## 2019-05-31 MED ORDER — ARTIFICIAL TEARS OPHTHALMIC OINT
TOPICAL_OINTMENT | OPHTHALMIC | Status: AC
  Filled 2019-05-31: qty 3.5

## 2019-05-31 MED ORDER — ROCURONIUM BROMIDE 10 MG/ML (PF) SYRINGE
PREFILLED_SYRINGE | INTRAVENOUS | Status: AC
  Filled 2019-05-31: qty 10

## 2019-05-31 MED ORDER — MIDAZOLAM HCL 2 MG/2ML IJ SOLN
INTRAMUSCULAR | Status: AC
  Filled 2019-05-31: qty 2

## 2019-05-31 MED ORDER — GABAPENTIN 300 MG PO CAPS
300.0000 mg | ORAL_CAPSULE | ORAL | Status: AC
  Administered 2019-05-31: 10:00:00 300 mg via ORAL
  Filled 2019-05-31: qty 1

## 2019-05-31 MED ORDER — PROPOFOL 10 MG/ML IV BOLUS
INTRAVENOUS | Status: AC
  Filled 2019-05-31: qty 40

## 2019-05-31 MED ORDER — DEXAMETHASONE SODIUM PHOSPHATE 10 MG/ML IJ SOLN
INTRAMUSCULAR | Status: AC
  Filled 2019-05-31: qty 1

## 2019-05-31 MED ORDER — ACETAMINOPHEN 160 MG/5ML PO SOLN
1000.0000 mg | Freq: Once | ORAL | Status: DC | PRN
  Filled 2019-05-31: qty 40.6

## 2019-05-31 MED ORDER — ACETAMINOPHEN 10 MG/ML IV SOLN
1000.0000 mg | Freq: Once | INTRAVENOUS | Status: DC | PRN
  Filled 2019-05-31: qty 100

## 2019-05-31 MED ORDER — ONDANSETRON HCL 4 MG/2ML IJ SOLN
INTRAMUSCULAR | Status: DC | PRN
  Administered 2019-05-31: 4 mg via INTRAVENOUS

## 2019-05-31 MED ORDER — OXYCODONE HCL 5 MG/5ML PO SOLN
5.0000 mg | Freq: Once | ORAL | Status: AC | PRN
  Filled 2019-05-31: qty 5

## 2019-05-31 MED ORDER — IBUPROFEN 800 MG PO TABS: 800.0000 mg | ORAL_TABLET | Freq: Three times a day (TID) | ORAL | 0 refills | Status: AC | PRN

## 2019-05-31 MED ORDER — OXYCODONE-ACETAMINOPHEN 5-325 MG PO TABS: 1.0000 | ORAL_TABLET | ORAL | 0 refills | Status: AC | PRN

## 2019-05-31 MED ORDER — SIMETHICONE 80 MG PO CHEW: 80.0000 mg | CHEWABLE_TABLET | Freq: Four times a day (QID) | ORAL | 0 refills | Status: AC | PRN

## 2019-05-31 MED ORDER — LACTATED RINGERS IV SOLN
INTRAVENOUS | Status: DC
  Administered 2019-05-31 (×2): via INTRAVENOUS
  Filled 2019-05-31: qty 1000

## 2019-05-31 MED ORDER — OXYCODONE HCL 5 MG PO TABS
5.0000 mg | ORAL_TABLET | Freq: Once | ORAL | Status: AC | PRN
  Administered 2019-05-31: 5 mg via ORAL
  Filled 2019-05-31: qty 1

## 2019-05-31 MED ORDER — MIDAZOLAM HCL 2 MG/2ML IJ SOLN
INTRAMUSCULAR | Status: DC | PRN
  Administered 2019-05-31: 2 mg via INTRAVENOUS

## 2019-05-31 MED ORDER — FENTANYL CITRATE (PF) 100 MCG/2ML IJ SOLN
INTRAMUSCULAR | Status: DC | PRN
  Administered 2019-05-31: 100 ug via INTRAVENOUS
  Administered 2019-05-31 (×2): 50 ug via INTRAVENOUS

## 2019-05-31 MED ORDER — FENTANYL CITRATE (PF) 100 MCG/2ML IJ SOLN
25.0000 ug | INTRAMUSCULAR | Status: DC | PRN
  Filled 2019-05-31: qty 1

## 2019-05-31 MED ORDER — ACETAMINOPHEN 500 MG PO TABS
1000.0000 mg | ORAL_TABLET | Freq: Once | ORAL | Status: DC | PRN
  Filled 2019-05-31: qty 2

## 2019-05-31 MED ORDER — SCOPOLAMINE 1 MG/3DAYS TD PT72
1.0000 | MEDICATED_PATCH | TRANSDERMAL | Status: DC
  Administered 2019-05-31: 1.5 mg via TRANSDERMAL
  Filled 2019-05-31: qty 1

## 2019-05-31 MED ORDER — KETOROLAC TROMETHAMINE 30 MG/ML IJ SOLN
INTRAMUSCULAR | Status: DC | PRN
  Administered 2019-05-31: 30 mg via INTRAVENOUS

## 2019-05-31 MED ORDER — PROPOFOL 10 MG/ML IV BOLUS
INTRAVENOUS | Status: DC | PRN
  Administered 2019-05-31: 30 mg via INTRAVENOUS
  Administered 2019-05-31: 110 mg via INTRAVENOUS

## 2019-05-31 MED ORDER — GABAPENTIN 300 MG PO CAPS
ORAL_CAPSULE | ORAL | Status: AC
  Filled 2019-05-31: qty 1

## 2019-05-31 MED ORDER — LIDOCAINE 2% (20 MG/ML) 5 ML SYRINGE
INTRAMUSCULAR | Status: AC
  Filled 2019-05-31: qty 5

## 2019-05-31 SURGICAL SUPPLY — 31 items
DERMABOND ADVANCED (GAUZE/BANDAGES/DRESSINGS) ×2
DERMABOND ADVANCED .7 DNX12 (GAUZE/BANDAGES/DRESSINGS) ×2 IMPLANT
DRSG OPSITE POSTOP 3X4 (GAUZE/BANDAGES/DRESSINGS) ×4 IMPLANT
FORCEPS CUTTING 33CM 5MM (CUTTING FORCEPS) IMPLANT
GLOVE BIOGEL PI IND STRL 6.5 (GLOVE) ×4 IMPLANT
GLOVE BIOGEL PI IND STRL 7.0 (GLOVE) ×4 IMPLANT
GLOVE BIOGEL PI INDICATOR 6.5 (GLOVE) ×4
GLOVE BIOGEL PI INDICATOR 7.0 (GLOVE) ×4
GLOVE ECLIPSE 6.0 STRL STRAW (GLOVE) ×4 IMPLANT
GOWN STRL REUS W/ TWL LRG LVL3 (GOWN DISPOSABLE) ×4 IMPLANT
GOWN STRL REUS W/TWL LRG LVL3 (GOWN DISPOSABLE) ×4
KIT TURNOVER CYSTO (KITS) ×4 IMPLANT
LIGASURE VESSEL 5MM BLUNT TIP (ELECTROSURGICAL) ×4 IMPLANT
NS IRRIG 1000ML POUR BTL (IV SOLUTION) IMPLANT
NS IRRIG 500ML POUR BTL (IV SOLUTION) ×4 IMPLANT
PACK LAPAROSCOPY BASIN (CUSTOM PROCEDURE TRAY) ×4 IMPLANT
PACK TRENDGUARD 450 HYBRID PRO (MISCELLANEOUS) ×2 IMPLANT
POUCH SPECIMEN RETRIEVAL 10MM (ENDOMECHANICALS) IMPLANT
PROTECTOR NERVE ULNAR (MISCELLANEOUS) ×8 IMPLANT
SET IRRIG TUBING LAPAROSCOPIC (IRRIGATION / IRRIGATOR) IMPLANT
SET TUBE SMOKE EVAC HIGH FLOW (TUBING) ×4 IMPLANT
SLEEVE ENDOPATH XCEL 5M (ENDOMECHANICALS) ×4 IMPLANT
SUT VICRYL 0 UR6 27IN ABS (SUTURE) ×4 IMPLANT
SUT VICRYL RAPIDE 3 0 (SUTURE) ×4 IMPLANT
TOWEL GREEN STERILE FF (TOWEL DISPOSABLE) IMPLANT
TOWEL OR 17X26 10 PK STRL BLUE (TOWEL DISPOSABLE) ×4 IMPLANT
TRAY FOLEY W/BAG SLVR 14FR (SET/KITS/TRAYS/PACK) ×4 IMPLANT
TRENDGUARD 450 HYBRID PRO PACK (MISCELLANEOUS) ×4
TROCAR XCEL NON-BLD 11X100MML (ENDOMECHANICALS) ×4 IMPLANT
TROCAR XCEL NON-BLD 5MMX100MML (ENDOMECHANICALS) ×8 IMPLANT
WARMER LAPAROSCOPE (MISCELLANEOUS) ×4 IMPLANT

## 2019-05-31 NOTE — Brief Op Note (Signed)
05/31/2019  11:54 AM  PATIENT:  Jamie Mosley  33 y.o. female  PRE-OPERATIVE DIAGNOSIS:  Desires sterilization, chronic pelvic pain  POST-OPERATIVE DIAGNOSIS:  Desires sterilization, chronic pelvic pain  PROCEDURE:  Procedure(s): LAPAROSCOPIC BILATERAL SALPINGECTOMY (Bilateral) LAPAROSCOPY DIAGNOSTIC (N/A)  SURGEON:  Surgeon(s) and Role: Panel 1:    * Taam-Akelman, Lawrence Santiago, MD - Primary    * Bobbye Charleston, MD - Assisting Panel 2:    * Jonelle Sidle, MD - Primary    * Bobbye Charleston, MD - Assisting  ANESTHESIA:   general  EBL: minimal  BLOOD ADMINISTERED:none  DRAINS: none   LOCAL MEDICATIONS USED:  LIDOCAINE   SPECIMEN: bilateral fallopian tubes  DISPOSITION OF SPECIMEN:  PATHOLOGY  COUNTS:  YES  TOURNIQUET:  * No tourniquets in log *  DICTATION: .Note written in EPIC  PLAN OF CARE: Discharge to home after PACU  PATIENT DISPOSITION:  PACU - hemodynamically stable.   Delay start of Pharmacological VTE agent (>24hrs) due to surgical blood loss or risk of bleeding: not applicable Jamie Mosley K Taam-Akelman 05/31/19 11:55 AM

## 2019-05-31 NOTE — Op Note (Signed)
PATIENT:  Jamie Mosley  33 y.o. female  PRE-OPERATIVE DIAGNOSIS:  Desires sterilization, chronic pelvic pain  POST-OPERATIVE DIAGNOSIS:  Desires sterilization, chronic pelvic pain  PROCEDURE:  Procedure(s): LAPAROSCOPIC BILATERAL SALPINGECTOMY (Bilateral) LAPAROSCOPY DIAGNOSTIC (N/A)  SURGEON:  Surgeon(s) and Role: Panel 1:    * Taam-Akelman, Lawrence Santiago, MD - Primary    * Bobbye Charleston, MD - Assisting Panel 2:    * Jonelle Sidle, MD - Primary    * Bobbye Charleston, MD - Assisting  ANESTHESIA:   general  EBL: minimal  BLOOD ADMINISTERED:none  DRAINS: none   LOCAL MEDICATIONS USED:  LIDOCAINE   SPECIMEN: bilateral fallopian tubes  DISPOSITION OF SPECIMEN:  PATHOLOGY  COUNTS:  YES  TOURNIQUET:  * No tourniquets in log *  PLAN OF CARE: Discharge to home after PACU  PATIENT DISPOSITION:  PACU - hemodynamically stable.   Delay start of Pharmacological VTE agent (>24hrs) due to surgical blood loss or risk of bleeding: not applicable  FINDINGS: normal appendix, normal ovaries and fallopian tubes bilaterally.  The bladder, pelvic sidewall, uterosacral ligaments, and colon were inspected and noted to not have any endometrial implants.  Description of procedure: After consent was verified, the patient was taken to the operating room where general anesthesia was administered without difficulty. The patient was placed in the dorsal lithotomy position using allen stirrups with her arms tucked to her sides. A vaginal and abdominal prep were performed. The patient was draped in the normal sterile fashion. A foley catheter was placed and the patient's bladder was drained. A speculm was placed in the vagina and a hulka uterine manipulator was inserted.  Attention was then turned to the abdomen where a 10 mm incision was made at the base of the umbiicus and a 62mm Optiview trocar was placed under direct visualization. The abdomen was insufflated with  CO2 gas.   A 43mm incision was made in the right lower quadrant approximately 4cm above the ASIS and another port was placed under direct visualization with care to avoid the inferior epigastrics. A 103mm incision was made in the left lower quadrant and a 3rd trochar was placed in similar fashion. The patient was placed in Trendelenburg. and findings included: normal appendix, normal ovaries and fallopian tubes bilaterally.  The bladder, pelvic sidewall, uterosacral ligaments, and colon were inspected and noted to not have any endometrial implants.  At this point, bilateral salpingectomy was performed in the standard fashion. The left fallopian tube was grasped and elevated after which the Ligasure was advanced up the mesosalpinx to separate the ovary and tube from the broad ligament. The Fallopian tube was cauterized and divided at the uterine cornua, and tube was removed through the LLQ port site. The same procedure was performed on the right side. The right fallopian tube was grasped and elevated after which the Ligasure was advanced up the mesosalpinx to separate the ovary and tube from the broad ligament. The right fallopian tube was cauterized and divided at the uterine cornua, and tube was removed through the LLQ port site. All pedicles were hemostatic. All instruments were removed from the operative field. The 5 mm ports were then removed and the CO2 pneumoperitoneum was allowed to escape. The umbilicus fascia was closed with UR 6 vicryl in a running fashion. The skin incisions were then closed with vicryl and Dermabond. The uterine manipulator was removed from the vagina, hemostasis noted. The patient was allowed to recover from general anesthesia, was taken to recovery room in stable condition.  Dayana Dalporto K Taam-Akelman 05/31/19 2:02 PM

## 2019-05-31 NOTE — Transfer of Care (Signed)
Immediate Anesthesia Transfer of Care Note  Patient: Jamie Mosley  Procedure(s) Performed: LAPAROSCOPIC BILATERAL SALPINGECTOMY (Bilateral Abdomen) LAPAROSCOPY DIAGNOSTIC (N/A Abdomen)  Patient Location: PACU  Anesthesia Type:General  Level of Consciousness: awake, alert , oriented and patient cooperative  Airway & Oxygen Therapy: Patient Spontanous Breathing and Patient connected to nasal cannula oxygen  Post-op Assessment: Report given to RN and Post -op Vital signs reviewed and stable  Post vital signs: Reviewed and stable  Last Vitals:  Vitals Value Taken Time  BP 102/51 05/31/19 1204  Temp    Pulse 67 05/31/19 1205  Resp 14 05/31/19 1205  SpO2 97 % 05/31/19 1205  Vitals shown include unvalidated device data.  Last Pain:  Vitals:   05/31/19 1018  TempSrc: Oral  PainSc: 0-No pain         Complications: No apparent anesthesia complications

## 2019-05-31 NOTE — Anesthesia Preprocedure Evaluation (Addendum)
Anesthesia Evaluation  Patient identified by MRN, date of birth, ID band Patient awake    Reviewed: Allergy & Precautions, NPO status , Patient's Chart, lab work & pertinent test results  History of Anesthesia Complications (+) history of anesthetic complications  Airway Mallampati: I  TM Distance: >3 FB Neck ROM: Full    Dental  (+) Dental Advisory Given, Edentulous Upper, Poor Dentition,    Pulmonary neg pulmonary ROS, neg recent URI,    breath sounds clear to auscultation       Cardiovascular negative cardio ROS   Rhythm:Regular     Neuro/Psych  Headaches, negative psych ROS   GI/Hepatic negative GI ROS, Neg liver ROS,   Endo/Other  negative endocrine ROS  Renal/GU negative Renal ROS     Musculoskeletal negative musculoskeletal ROS (+)   Abdominal   Peds  Hematology negative hematology ROS (+)   Anesthesia Other Findings   Reproductive/Obstetrics                            Anesthesia Physical Anesthesia Plan  ASA: I  Anesthesia Plan: General   Post-op Pain Management:    Induction: Intravenous  PONV Risk Score and Plan: 3 and Ondansetron and Dexamethasone  Airway Management Planned: Oral ETT  Additional Equipment: None  Intra-op Plan:   Post-operative Plan: Extubation in OR  Informed Consent: I have reviewed the patients History and Physical, chart, labs and discussed the procedure including the risks, benefits and alternatives for the proposed anesthesia with the patient or authorized representative who has indicated his/her understanding and acceptance.     Dental advisory given  Plan Discussed with: CRNA and Surgeon  Anesthesia Plan Comments:         Anesthesia Quick Evaluation

## 2019-05-31 NOTE — Discharge Instructions (Signed)
Prescriptions Motrin 800mg  every 8 hours for pain Acetaminophen 650mg  every 6 hours for moderate pain Percocet (Oxycodone 5mg -acetaminophen 325mg ) every 4 hours for severe pain.  Make sure to not exceed acetaminophen 3000mg  every day.  Laparoscopic Tubal Ligation, Care After This sheet gives you information about how to care for yourself after your procedure. Your health care provider may also give you more specific instructions. If you have problems or questions, contact your health care provider. What can I expect after the procedure? After the procedure, it is common to have:  A sore throat.  Discomfort in your shoulder.  Mild discomfort or cramping in your abdomen.  Gas pains.  Pain or soreness in the area where the surgical incision was made.  A bloated feeling.  Tiredness.  Nausea.  Vomiting. Follow these instructions at home: Medicines  Take over-the-counter and prescription medicines only as told by your health care provider.  Do not take aspirin because it can cause bleeding.  Ask your health care provider if the medicine prescribed to you: ? Requires you to avoid driving or using heavy machinery. ? Can cause constipation. You may need to take actions to prevent or treat constipation, such as:  Drink enough fluid to keep your urine pale yellow.  Take over-the-counter or prescription medicines.  Eat foods that are high in fiber, such as beans, whole grains, and fresh fruits and vegetables.  Limit foods that are high in fat and processed sugars, such as fried or sweet foods. Incision care      Follow instructions from your health care provider about how to take care of your incision. Make sure you: ? Wash your hands with soap and water before and after you change your bandage (dressing). If soap and water are not available, use hand sanitizer. ? Change your dressing as told by your health care provider. ? Leave stitches (sutures), skin glue, or adhesive  strips in place. These skin closures may need to stay in place for 2 weeks or longer. If adhesive strip edges start to loosen and curl up, you may trim the loose edges. Do not remove adhesive strips completely unless your health care provider tells you to do that.  Check your incision area every day for signs of infection. Check for: ? Redness, swelling, or pain. ? Fluid or blood. ? Warmth. ? Pus or a bad smell. Activity  Rest as told by your health care provider.  Avoid sitting for a long time without moving. Get up to take short walks every 1-2 hours. This is important to improve blood flow and breathing. Ask for help if you feel weak or unsteady.  Return to your normal activities as told by your health care provider. Ask your health care provider what activities are safe for you. General instructions  Do not take baths, swim, or use a hot tub until your health care provider approves. Ask your health care provider if you may take showers. You may only be allowed to take sponge baths.  Have someone help you with your daily household tasks for the first few days.  Keep all follow-up visits as told by your health care provider. This is important. Contact a health care provider if:  You have redness, swelling, or pain around your incision.  Your incision feels warm to the touch.  You have pus or a bad smell coming from your incision.  The edges of your incision break open after the sutures have been removed.  Your pain does not improve  after 2-3 days.  You have a rash.  You repeatedly become dizzy or light-headed.  Your pain medicine is not helping. Get help right away if you:  Have a fever.  Faint.  Have increasing pain in your abdomen.  Have severe pain in one or both of your shoulders.  Have fluid or blood coming from your sutures or from your vagina.  Have shortness of breath or difficulty breathing.  Have chest pain or leg pain.  Have ongoing nausea, vomiting,  or diarrhea. Summary  After the procedure, it is common to have mild discomfort or cramping in your abdomen.  Take over-the-counter and prescription medicines only as told by your health care provider.  Watch for symptoms that should prompt you to call your health care provider.  Keep all follow-up visits as told by your health care provider. This is important. This information is not intended to replace advice given to you by your health care provider. Make sure you discuss any questions you have with your health care provider. Document Released: 01/09/2005 Document Revised: 05/17/2018 Document Reviewed: 05/17/2018 Elsevier Patient Education  2020 [employer].   May take Tylenol at 2 PM if needed for pain.   Post Anesthesia Home Care Instructions  Activity: Get plenty of rest for the remainder of the day. A responsible individual must stay with you for 24 hours following the procedure.  For the next 24 hours, DO NOT: -Drive a car -Paediatric nurse -Drink alcoholic beverages -Take any medication unless instructed by your physician -Make any legal decisions or sign important papers.  Meals: Start with liquid foods such as gelatin or soup. Progress to regular foods as tolerated. Avoid greasy, spicy, heavy foods. If nausea and/or vomiting occur, drink only clear liquids until the nausea and/or vomiting subsides. Call your physician if vomiting continues.  Special Instructions/Symptoms: Your throat may feel dry or sore from the anesthesia or the breathing tube placed in your throat during surgery. If this causes discomfort, gargle with warm salt water. The discomfort should disappear within 24 hours.  If you had a scopolamine patch placed behind your ear for the management of post- operative nausea and/or vomiting:  1. The medication in the patch is effective for 72 hours, after which it should be removed.  Wrap patch in a tissue and discard in the trash. Wash hands thoroughly  with soap and water. 2. You may remove the patch earlier than 72 hours if you experience unpleasant side effects which may include dry mouth, dizziness or visual disturbances. 3. Avoid touching the patch. Wash your hands with soap and water after contact with the patch.    May remove patch behind left ear Saturday, June 03, 2019.

## 2019-05-31 NOTE — Anesthesia Procedure Notes (Signed)
Procedure Name: Intubation Date/Time: 05/31/2019 11:04 AM Performed by: Wanita Chamberlain, CRNA Pre-anesthesia Checklist: Patient being monitored, Suction available, Emergency Drugs available, Timeout performed and Patient identified Patient Re-evaluated:Patient Re-evaluated prior to induction Oxygen Delivery Method: Circle system utilized Preoxygenation: Pre-oxygenation with 100% oxygen Induction Type: IV induction Ventilation: Mask ventilation without difficulty Laryngoscope Size: Mac and 3 Grade View: Grade I Tube type: Oral Tube size: 7.0 mm Number of attempts: 1 Airway Equipment and Method: Stylet Placement Confirmation: breath sounds checked- equal and bilateral,  CO2 detector,  positive ETCO2 and ETT inserted through vocal cords under direct vision Secured at: 20 cm Tube secured with: Tape Dental Injury: Teeth and Oropharynx as per pre-operative assessment

## 2019-05-31 NOTE — H&P (Signed)
Jamie Mosley is an 33 y.o. female  Here for scheduled diagnostic laparoscopic and bilateral tubal ligation.   History of chronic pelvic pain and dyspareunia consistent with endometriosis. Minimal relief with combined oral contraceptives and did not tolerate orlissa 2/2 hot flashes.   She has undesired future fertility and desires to proceed with bilateral salpingectomy for permanent birth control. Previously reviewed LARCs and she desires permanent sterilization.   GYN history: remote history of chlamydia and syphilis, sp rx.  HSV2 - rare outbreaks  OB history: G1 TSVD, G2 was a twin pregnancy but twin B had Tri18. She reports laceration with G1, unsure with G2, but denies incontinence issues after delivery.  Past Medical History:  Diagnosis Date  . Back pain   . Family history of adverse reaction to anesthesia    mother ponv  . GERD (gastroesophageal reflux disease)   . Headache    tension    Past Surgical History:  Procedure Laterality Date  . WISDOM TOOTH EXTRACTION      History reviewed. No pertinent family history.  Social History:  reports that she has never smoked. She has never used smokeless tobacco. She reports current alcohol use. She reports that she does not use drugs.  Allergies:  Allergies  Allergen Reactions  . Sweet Potato     Face swells    Medications Prior to Admission  Medication Sig Dispense Refill Last Dose  . NON FORMULARY daily. Birth control pill   05/30/2019 at Unknown time  . UNABLE TO FIND at bedtime. Med Name:  Allergy relief target brand   05/30/2019 at Unknown time    ROS pertinent ROS per HPI otherwise negative  Height 5' (1.524 m), weight 67.6 kg, last menstrual period 05/19/2019. Physical Exam  No acute distress  Results for orders placed or performed during the hospital encounter of 05/31/19 (from the past 24 hour(s))  Pregnancy, urine POC     Status: None   Collection Time: 05/31/19  9:51 AM  Result Value Ref Range   Preg  Test, Ur NEGATIVE NEGATIVE  CBC     Status: None   Collection Time: 05/31/19 10:02 AM  Result Value Ref Range   WBC 9.6 4.0 - 10.5 K/uL   RBC 4.60 3.87 - 5.11 MIL/uL   Hemoglobin 14.4 12.0 - 15.0 g/dL   HCT 43.5 36.0 - 46.0 %   MCV 94.6 80.0 - 100.0 fL   MCH 31.3 26.0 - 34.0 pg   MCHC 33.1 30.0 - 36.0 g/dL   RDW 12.0 11.5 - 15.5 %   Platelets 231 150 - 400 K/uL   nRBC 0.0 0.0 - 0.2 %    Assessment/Plan: Jamie Mosley 33 y.o. here for diagnostic laparoscopy, possible excision of endometriosis, bilateral tubal ligation via bilateral salpingectomy.  This procedure has been fully reviewed with the patient and written informed consent has been obtained.  Nuchem Grattan K Taam-Akelman 05/31/2019, 10:23 AM

## 2019-06-02 LAB — SURGICAL PATHOLOGY

## 2019-06-06 ENCOUNTER — Encounter (HOSPITAL_BASED_OUTPATIENT_CLINIC_OR_DEPARTMENT_OTHER): Payer: Self-pay | Admitting: Obstetrics & Gynecology

## 2019-06-08 NOTE — Anesthesia Postprocedure Evaluation (Signed)
Anesthesia Post Note  Patient: Jamie Mosley  Procedure(s) Performed: LAPAROSCOPIC BILATERAL SALPINGECTOMY (Bilateral Abdomen) LAPAROSCOPY DIAGNOSTIC (N/A Abdomen)     Patient location during evaluation: PACU Anesthesia Type: General Level of consciousness: awake and alert Pain management: pain level controlled Vital Signs Assessment: post-procedure vital signs reviewed and stable Respiratory status: spontaneous breathing, nonlabored ventilation, respiratory function stable and patient connected to nasal cannula oxygen Cardiovascular status: blood pressure returned to baseline and stable Postop Assessment: no apparent nausea or vomiting Anesthetic complications: no    Last Vitals:  Vitals:   05/31/19 1230 05/31/19 1301  BP: 100/69 112/71  Pulse: 73 61  Resp: 20 16  Temp:  (!) 36.4 C  SpO2: 98% 99%    Last Pain:  Vitals:   05/31/19 1330  TempSrc:   PainSc: 3                  Kolson Chovanec
